# Patient Record
Sex: Male | Born: 1970 | Race: White | Hispanic: No | Marital: Married | Smoking: Never smoker
Health system: Southern US, Community
[De-identification: ages and names within clinical notes are randomized; demographics above are authoritative.]

## PROBLEM LIST (undated history)

## (undated) DIAGNOSIS — Z789 Other specified health status: Secondary | ICD-10-CM

---

## 2012-09-20 HISTORY — PX: MOUTH BIOPSY: SHX1012

## 2012-12-19 ENCOUNTER — Encounter (HOSPITAL_BASED_OUTPATIENT_CLINIC_OR_DEPARTMENT_OTHER): Payer: Self-pay | Admitting: *Deleted

## 2012-12-19 NOTE — Progress Notes (Signed)
No labs needed

## 2012-12-20 NOTE — H&P (Signed)
  This is a 42y/o wd/wn w male who presents with a large radiolucency of the left mandible.  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 The treatment plan is to enucleate the lesion and do a peripheral ostectomy and a chemical cauterization.

## 2012-12-20 NOTE — H&P (Signed)
Tanner Williams is an 42 y.o. male.   Chief Complaint: cyst left mandible HPI: unknown duration  Past Medical History  Diagnosis Date  . Medical history non-contributory     Past Surgical History  Procedure Laterality Date  . Mouth biopsy  7/14    jaw bone    History reviewed. No pertinent family history. Social History:  reports that he has never smoked. He does not have any smokeless tobacco history on file. He reports that  drinks alcohol. He reports that he does not use illicit drugs.  Allergies: No Known Allergies  No prescriptions prior to admission    No results found for this or any previous visit (from the past 48 hour(s)). No results found.  Review of Systems  All other systems reviewed and are negative.    Height 5\' 10"  (1.778 m), weight 86.183 kg (190 lb). Physical Exam  Vitals reviewed. Constitutional: He is oriented to person, place, and time. He appears well-developed and well-nourished.  HENT:  Head: Normocephalic and atraumatic.    Right Ear: External ear normal.  Left Ear: External ear normal.  Nose: Nose normal.  Mouth/Throat: Oropharynx is clear and moist.    Eyes: Conjunctivae and EOM are normal. Pupils are equal, round, and reactive to light.  Neck: Normal range of motion. Neck supple.  Cardiovascular: Normal rate, regular rhythm, normal heart sounds and intact distal pulses.   Respiratory: Effort normal and breath sounds normal.  GI: Soft. Bowel sounds are normal.  Musculoskeletal: Normal range of motion.  Neurological: He is alert and oriented to person, place, and time. He has normal reflexes.  Skin: Skin is warm and dry.  Psychiatric: He has a normal mood and affect. His behavior is normal. Judgment and thought content normal.     Assessment/Plan Surgical removal of tumor with mechanical and chemical cauterization  [name],JOSEPH L 12/20/2012, 5:02 PM

## 2012-12-22 ENCOUNTER — Ambulatory Visit (HOSPITAL_BASED_OUTPATIENT_CLINIC_OR_DEPARTMENT_OTHER): Payer: 59 | Admitting: *Deleted

## 2012-12-22 ENCOUNTER — Encounter (HOSPITAL_BASED_OUTPATIENT_CLINIC_OR_DEPARTMENT_OTHER): Payer: Self-pay | Admitting: *Deleted

## 2012-12-22 ENCOUNTER — Ambulatory Visit (HOSPITAL_BASED_OUTPATIENT_CLINIC_OR_DEPARTMENT_OTHER)
Admission: RE | Admit: 2012-12-22 | Discharge: 2012-12-22 | Disposition: A | Discharge status: Home / Self Care | Payer: 59 | Source: Ambulatory Visit | Attending: Oral Surgery | Admitting: Oral Surgery

## 2012-12-22 ENCOUNTER — Encounter (HOSPITAL_BASED_OUTPATIENT_CLINIC_OR_DEPARTMENT_OTHER)
Admission: RE | Disposition: A | Discharge status: Home / Self Care | Payer: Self-pay | Source: Ambulatory Visit | Attending: Oral Surgery

## 2012-12-22 DIAGNOSIS — K090 Developmental odontogenic cysts: Secondary | ICD-10-CM | POA: Insufficient documentation

## 2012-12-22 HISTORY — DX: Other specified health status: Z78.9

## 2012-12-22 HISTORY — PX: EXCISION ORAL TUMOR: SHX6265

## 2012-12-22 SURGERY — EXCISION, NEOPLASM, MOUTH
Anesthesia: General | Site: Mouth | Laterality: Left | Wound class: Clean Contaminated

## 2012-12-22 MED ORDER — CEPHALEXIN 500 MG PO CAPS: 500.0000 mg | ORAL_CAPSULE | Freq: Four times a day (QID) | ORAL | Status: DC

## 2012-12-22 MED ORDER — LIDOCAINE HCL (CARDIAC) 20 MG/ML IV SOLN
INTRAVENOUS | Status: DC | PRN
  Administered 2012-12-22: 40 mg via INTRAVENOUS

## 2012-12-22 MED ORDER — METHYLENE BLUE 1 % INJ SOLN
INTRAMUSCULAR | Status: DC | PRN
  Administered 2012-12-22: 5 mL via SUBMUCOSAL

## 2012-12-22 MED ORDER — PROPOFOL 10 MG/ML IV BOLUS
INTRAVENOUS | Status: DC | PRN
  Administered 2012-12-22: 250 mg via INTRAVENOUS

## 2012-12-22 MED ORDER — FENTANYL CITRATE 0.05 MG/ML IJ SOLN
INTRAMUSCULAR | Status: DC | PRN
  Administered 2012-12-22: 100 ug via INTRAVENOUS

## 2012-12-22 MED ORDER — OXYMETAZOLINE HCL 0.05 % NA SOLN
NASAL | Status: DC | PRN
  Administered 2012-12-22: 2 via NASAL

## 2012-12-22 MED ORDER — MIDAZOLAM HCL 2 MG/2ML IJ SOLN: 0.5000 mg | Freq: Once | INTRAMUSCULAR | Status: DC | PRN

## 2012-12-22 MED ORDER — MEPERIDINE HCL 25 MG/ML IJ SOLN: 6.2500 mg | INTRAMUSCULAR | Status: DC | PRN

## 2012-12-22 MED ORDER — OXYCODONE-ACETAMINOPHEN 5-325 MG PO TABS: 1.0000 | ORAL_TABLET | ORAL | Status: DC | PRN

## 2012-12-22 MED ORDER — ONDANSETRON HCL 4 MG/2ML IJ SOLN
INTRAMUSCULAR | Status: DC | PRN
  Administered 2012-12-22: 4 mg via INTRAVENOUS

## 2012-12-22 MED ORDER — HYDROMORPHONE HCL PF 1 MG/ML IJ SOLN: 0.2500 mg | INTRAMUSCULAR | Status: DC | PRN

## 2012-12-22 MED ORDER — PROMETHAZINE HCL 25 MG/ML IJ SOLN: 6.2500 mg | INTRAMUSCULAR | Status: DC | PRN

## 2012-12-22 MED ORDER — OXYCODONE-ACETAMINOPHEN 5-325 MG PO TABS: 1.0000 | ORAL_TABLET | ORAL | Status: AC | PRN

## 2012-12-22 MED ORDER — CEFAZOLIN SODIUM-DEXTROSE 2-3 GM-% IV SOLR
2.0000 g | INTRAVENOUS | Status: AC
  Administered 2012-12-22: 2 g via INTRAVENOUS

## 2012-12-22 MED ORDER — LACTATED RINGERS IV SOLN
INTRAVENOUS | Status: DC
  Administered 2012-12-22 (×2): via INTRAVENOUS

## 2012-12-22 MED ORDER — CEPHALEXIN 500 MG PO CAPS: 500.0000 mg | ORAL_CAPSULE | Freq: Four times a day (QID) | ORAL | Status: AC

## 2012-12-22 MED ORDER — ARTIFICIAL TEARS OP OINT
TOPICAL_OINTMENT | OPHTHALMIC | Status: DC | PRN
  Administered 2012-12-22: 1 via OPHTHALMIC

## 2012-12-22 MED ORDER — MIDAZOLAM HCL 5 MG/5ML IJ SOLN
INTRAMUSCULAR | Status: DC | PRN
  Administered 2012-12-22: 2 mg via INTRAVENOUS

## 2012-12-22 MED ORDER — SODIUM CHLORIDE 0.9 % IV SOLN
INTRAVENOUS | Status: DC | PRN
  Administered 2012-12-22: 100 mL via INTRAMUSCULAR

## 2012-12-22 MED ORDER — BACITRACIN-NEOMYCIN-POLYMYXIN 400-5-5000 EX OINT
TOPICAL_OINTMENT | CUTANEOUS | Status: DC | PRN
  Administered 2012-12-22: 1 via TOPICAL

## 2012-12-22 MED ORDER — OXYCODONE HCL 5 MG/5ML PO SOLN: 5.0000 mg | Freq: Once | ORAL | Status: DC | PRN

## 2012-12-22 MED ORDER — LIDOCAINE-EPINEPHRINE 2 %-1:100000 IJ SOLN
INTRAMUSCULAR | Status: DC | PRN
  Administered 2012-12-22: 3 mL

## 2012-12-22 MED ORDER — DEXAMETHASONE SODIUM PHOSPHATE 4 MG/ML IJ SOLN
INTRAMUSCULAR | Status: DC | PRN
  Administered 2012-12-22: 10 mg via INTRAVENOUS

## 2012-12-22 MED ORDER — OXYCODONE HCL 5 MG PO TABS: 5.0000 mg | ORAL_TABLET | Freq: Once | ORAL | Status: DC | PRN

## 2012-12-22 MED ORDER — SUCCINYLCHOLINE CHLORIDE 20 MG/ML IJ SOLN
INTRAMUSCULAR | Status: DC | PRN
  Administered 2012-12-22: 140 mg via INTRAVENOUS

## 2012-12-22 SURGICAL SUPPLY — 35 items
BENZOIN TINCTURE PRP APPL 2/3 (GAUZE/BANDAGES/DRESSINGS) ×28 IMPLANT
BLADE SURG 15 STRL LF DISP TIS (BLADE) ×1 IMPLANT
BLADE SURG 15 STRL SS (BLADE) ×1
BUR OVAL 4.0X59 (BURR) ×2 IMPLANT
CANISTER SUCTION 1200CC (MISCELLANEOUS) ×2 IMPLANT
CARNOY"S SOLUTION ×2 IMPLANT
CATH ROBINSON RED A/P 10FR (CATHETERS) IMPLANT
CLOTH BEACON ORANGE TIMEOUT ST (SAFETY) IMPLANT
COVER MAYO STAND STRL (DRAPES) ×2 IMPLANT
COVER TABLE BACK 60X90 (DRAPES) ×2 IMPLANT
DRAPE U-SHAPE 76X120 STRL (DRAPES) ×2 IMPLANT
GAUZE PACKING IODOFORM 1/4X5 (PACKING) ×2 IMPLANT
GLOVE BIO SURGEON STRL SZ 6.5 (GLOVE) ×4 IMPLANT
GLOVE BIO SURGEON STRL SZ7.5 (GLOVE) ×2 IMPLANT
GLOVE EXAM NITRILE MD LF STRL (GLOVE) ×2 IMPLANT
GOWN PREVENTION PLUS XLARGE (GOWN DISPOSABLE) ×4 IMPLANT
GOWN PREVENTION PLUS XXLARGE (GOWN DISPOSABLE) ×2 IMPLANT
IV NS 500ML (IV SOLUTION)
IV NS 500ML BAXH (IV SOLUTION) IMPLANT
NEEDLE DENTAL 27 LONG (NEEDLE) ×2 IMPLANT
NS IRRIG 1000ML POUR BTL (IV SOLUTION) ×2 IMPLANT
PACK BASIN DAY SURGERY FS (CUSTOM PROCEDURE TRAY) ×2 IMPLANT
PATTIES SURGICAL .5 X3 (DISPOSABLE) ×2 IMPLANT
SLEEVE SCD COMPRESS KNEE MED (MISCELLANEOUS) ×2 IMPLANT
SPONGE SURGIFOAM ABS GEL 12-7 (HEMOSTASIS) IMPLANT
SUT CHROMIC 3 0 PS 2 (SUTURE) ×2 IMPLANT
SUT CHROMIC 4 0 P 3 18 (SUTURE) IMPLANT
SUT SILK 3 0 PS 1 (SUTURE) IMPLANT
SUT VICRYL 4-0 PS2 18IN ABS (SUTURE) ×4 IMPLANT
SYR 50ML LL SCALE MARK (SYRINGE) ×4 IMPLANT
TOWEL OR 17X24 6PK STRL BLUE (TOWEL DISPOSABLE) ×2 IMPLANT
TOWEL OR NON WOVEN STRL DISP B (DISPOSABLE) ×2 IMPLANT
TUBE CONNECTING 20X1/4 (TUBING) ×2 IMPLANT
VENT IRR SPI W TUB AD (MISCELLANEOUS) IMPLANT
YANKAUER SUCT BULB TIP NO VENT (SUCTIONS) ×2 IMPLANT

## 2012-12-22 NOTE — Anesthesia Procedure Notes (Addendum)
Procedure Name: Intubation Date/Time: 12/22/2012 1:20 PM Performed by: Meyer Russel Pre-anesthesia Checklist: Patient identified, Emergency Drugs available, Suction available and Patient being monitored Patient Re-evaluated:Patient Re-evaluated prior to inductionOxygen Delivery Method: Circle System Utilized Preoxygenation: Pre-oxygenation with 100% oxygen Intubation Type: IV induction Ventilation: Mask ventilation without difficulty Laryngoscope Size: Miller and 2 Grade View: Grade I Nasal Tubes: Nasal prep performed, Nasal Rae and Right Tube size: 7.0 mm Number of attempts: 1 Placement Confirmation: ETT inserted through vocal cords under direct vision,  positive ETCO2 and breath sounds checked- equal and bilateral Secured at: 27 (NTT secured, tip of nose checked, gauze under  NTT at forehead) cm Tube secured with: Tape Dental Injury: Teeth and Oropharynx as per pre-operative assessment

## 2012-12-22 NOTE — Anesthesia Postprocedure Evaluation (Signed)
  Anesthesia Post-op Note  Patient: Tanner Williams  Procedure(s) Performed: Procedure(s): SURGICAL REMOVAL OF TUMOR WITH CARNOYS SOLUTION, PERTHERAL OSTECTOMY, EXTRACTIONS OF TEETH #16 & #17 (Left)  Patient Location: PACU  Anesthesia Type:General  Level of Consciousness: awake, alert , oriented and patient cooperative  Airway and Oxygen Therapy: Patient Spontanous Breathing  Post-op Pain: mild  Post-op Assessment: Post-op Vital signs reviewed, Patient's Cardiovascular Status Stable, Respiratory Function Stable, Patent Airway, No signs of Nausea or vomiting and Pain level controlled  Post-op Vital Signs: Reviewed and stable  Complications: No apparent anesthesia complications

## 2012-12-22 NOTE — Brief Op Note (Signed)
12/22/2012  2:27 PM  PATIENT:  Riccardo Dubin  42 y.o. male  PRE-OPERATIVE DIAGNOSIS:  ODONTOGENIC KERATOCYST  POST-OPERATIVE DIAGNOSIS:  ODONTOGENIC KERATOCYST  PROCEDURE:  Procedure(s): SURGICAL REMOVAL OF TUMOR WITH CARNOYS SOLUTION, PERTHERAL OSTECTOMY, EXTRACTIONS OF TEETH #16 & #17 (Left)  SURGEON:  Surgeon(s) and Role:    * Hinton Dyer, DDS - Primary  PHYSICIAN ASSISTANT:   ASSISTANTS: Hadassah Pais, Montel Culver   ANESTHESIA:   general  EBL:  Total I/O In: 1650 [I.V.:1650] Out: -   BLOOD ADMINISTERED:none  DRAINS: Penrose drain in the surgical defect   LOCAL MEDICATIONS USED:  XYLOCAINE   SPECIMEN:  Source of Specimen:  left lesion of mandible  DISPOSITION OF SPECIMEN:  PATHOLOGY  COUNTS:  YES  TOURNIQUET:  * No tourniquets in log *  DICTATION: .Dragon Dictation  PLAN OF CARE: Discharge to home after PACU  PATIENT DISPOSITION:  PACU - hemodynamically stable.   Delay start of Pharmacological VTE agent (>24hrs) due to surgical blood loss or risk of bleeding:n/a

## 2012-12-22 NOTE — H&P (Signed)
  There is no change in the medical history.  Procedure has been reviewed.

## 2012-12-22 NOTE — Op Note (Signed)
Patient was brought to the operating room in the supine position and which remained throughout the whole procedure. He was intubated via right nasoendotracheal tube.  He was then prepped with Betadine scrub and paint intraorally and extraorally. He was then prepped and draped in usual fashion for an intraoral procedure. The throat was suctioned out and a moist open 4 x 4 gauze was placed around the endotracheal tube. 2 carpules of 2% Xylocaine with 1-100,000 epinephrine was given as a block and infiltration of the left maxilla and mandible. A child-size bite block was placed. A periosteal elevator reflected a full-thickness mucoperiosteal flap around #16 and #17. #16 was mobilized with a 11-A. elevator and removed with an upper universal forceps. The socket was curetted and closed with a 3-0 chromic suture. #17 was mobilized with an 11-A. elevator and removed with a lower universal forceps. A #15 blade then made an incision down the ascending ramus to the first second molar region. A small release was created the area between #18 and #19. A full-thickness mucoperiosteal flap was elevated with a periosteal elevator. Bone covering the lesion was then removed with a round bur copious irrigation. Once the opening could be visualized it was enlarged to expose the whole tumor. A large curved curette was then used to to separated from the bony walls of the buccal and lingual bone. The lesion was removed in several pieces and submitted for pathological examination. Once the whole area was free of any soft tissue was aggressively curetted and irrigated. He was then filled with methylene blue and suctioned out so as to stain the bone. A peripheral ostectomy was then performed using a large Alfio bur and copious irrigation. Once the area was completely free of any of the stained bone it was irrigated copiously with several 100 cc's of saline. Gauze soaked with the coronoid solution was then packed into the defect and left in  place for 5 minutes. He was then removed and the area was irrigated. One quarter-inch iodoform gauze covered with benzoin and Neosporin was then packed into the defect to obliterate the bony defect. He was then brought out to the buccal aspect with releasing incision was the area was closed with multiple 4-0 Vicryl sutures. Primary closure was accomplished. The patient tolerated the procedure well and was extubated on the table after removal of the throat pack. He was then returned to the recovery room in good condition. He'll be followed by me in my private office.

## 2012-12-22 NOTE — Transfer of Care (Signed)
Immediate Anesthesia Transfer of Care Note  Patient: Tanner Williams  Procedure(s) Performed: Procedure(s): SURGICAL REMOVAL OF TUMOR WITH CARNOYS SOLUTION, PERTHERAL OSTECTOMY, EXTRACTIONS OF TEETH #16 & #17 (Left)  Patient Location: PACU  Anesthesia Type:General  Level of Consciousness: awake, alert  and oriented  Airway & Oxygen Therapy: Patient Spontanous Breathing and Patient connected to face mask oxygen  Post-op Assessment: Report given to PACU RN, Post -op Vital signs reviewed and stable and Patient moving all extremities  Post vital signs: Reviewed and stable  Complications: No apparent anesthesia complications

## 2012-12-22 NOTE — Anesthesia Preprocedure Evaluation (Signed)
Anesthesia Evaluation  Patient identified by MRN, date of birth, ID band Patient awake    Reviewed: Allergy & Precautions, H&P , NPO status , Patient's Chart, lab work & pertinent test results  History of Anesthesia Complications Negative for: history of anesthetic complications  Airway Mallampati: I TM Distance: >3 FB Neck ROM: Full    Dental  (+) Teeth Intact and Dental Advisory Given   Pulmonary neg pulmonary ROS,  breath sounds clear to auscultation  Pulmonary exam normal       Cardiovascular negative cardio ROS  Rhythm:Regular Rate:Normal     Neuro/Psych negative neurological ROS     GI/Hepatic negative GI ROS, Neg liver ROS,   Endo/Other  negative endocrine ROS  Renal/GU negative Renal ROS     Musculoskeletal   Abdominal   Peds  Hematology   Anesthesia Other Findings   Reproductive/Obstetrics                           Anesthesia Physical Anesthesia Plan  ASA: I  Anesthesia Plan: General   Post-op Pain Management:    Induction: Intravenous  Airway Management Planned: Nasal ETT  Additional Equipment:   Intra-op Plan:   Post-operative Plan: Extubation in OR  Informed Consent: I have reviewed the patients History and Physical, chart, labs and discussed the procedure including the risks, benefits and alternatives for the proposed anesthesia with the patient or authorized representative who has indicated his/her understanding and acceptance.   Dental advisory given  Plan Discussed with: CRNA and Surgeon  Anesthesia Plan Comments: (Plan routine monitors, GETA with NT ETT)        Anesthesia Quick Evaluation

## 2012-12-26 ENCOUNTER — Encounter (HOSPITAL_BASED_OUTPATIENT_CLINIC_OR_DEPARTMENT_OTHER): Payer: Self-pay | Admitting: Oral Surgery

## 2013-01-17 ENCOUNTER — Ambulatory Visit
Admission: RE | Admit: 2013-01-17 | Discharge: 2013-01-17 | Disposition: A | Discharge status: Home / Self Care | Payer: 59 | Source: Ambulatory Visit | Attending: Physician Assistant | Admitting: Physician Assistant

## 2013-01-17 ENCOUNTER — Other Ambulatory Visit: Payer: Self-pay | Admitting: Physician Assistant

## 2013-01-17 DIAGNOSIS — M545 Low back pain, unspecified: Secondary | ICD-10-CM

## 2015-01-24 ENCOUNTER — Other Ambulatory Visit: Payer: Self-pay | Admitting: Family Medicine

## 2015-01-24 DIAGNOSIS — R202 Paresthesia of skin: Secondary | ICD-10-CM

## 2015-02-13 ENCOUNTER — Ambulatory Visit
Admission: RE | Admit: 2015-02-13 | Discharge: 2015-02-13 | Disposition: A | Discharge status: Home / Self Care | Payer: Self-pay | Source: Ambulatory Visit | Attending: Family Medicine | Admitting: Family Medicine

## 2015-02-13 ENCOUNTER — Other Ambulatory Visit: Payer: Self-pay | Admitting: Family Medicine

## 2015-02-13 ENCOUNTER — Ambulatory Visit
Admission: RE | Admit: 2015-02-13 | Discharge: 2015-02-13 | Disposition: A | Discharge status: Home / Self Care | Payer: Commercial Managed Care - HMO | Source: Ambulatory Visit | Attending: Family Medicine | Admitting: Family Medicine

## 2015-02-13 DIAGNOSIS — Z77018 Contact with and (suspected) exposure to other hazardous metals: Secondary | ICD-10-CM

## 2015-02-13 DIAGNOSIS — R202 Paresthesia of skin: Secondary | ICD-10-CM

## 2016-01-29 ENCOUNTER — Other Ambulatory Visit: Payer: Self-pay | Admitting: Family Medicine

## 2016-01-29 ENCOUNTER — Ambulatory Visit
Admission: RE | Admit: 2016-01-29 | Discharge: 2016-01-29 | Disposition: A | Discharge status: Home / Self Care | Payer: Commercial Managed Care - HMO | Source: Ambulatory Visit | Attending: Family Medicine | Admitting: Family Medicine

## 2016-01-29 DIAGNOSIS — R1032 Left lower quadrant pain: Secondary | ICD-10-CM

## 2018-04-23 IMAGING — CR DG ABDOMEN 2V
2 series · 2 of 2 positions shown · non-contrast
Comparison: Lumbar spine series of January 17, 2013

CLINICAL DATA: Left lower quadrant pain for the past 2 weeks

EXAM:
ABDOMEN - 2 VIEW

[w abdomen upright *]
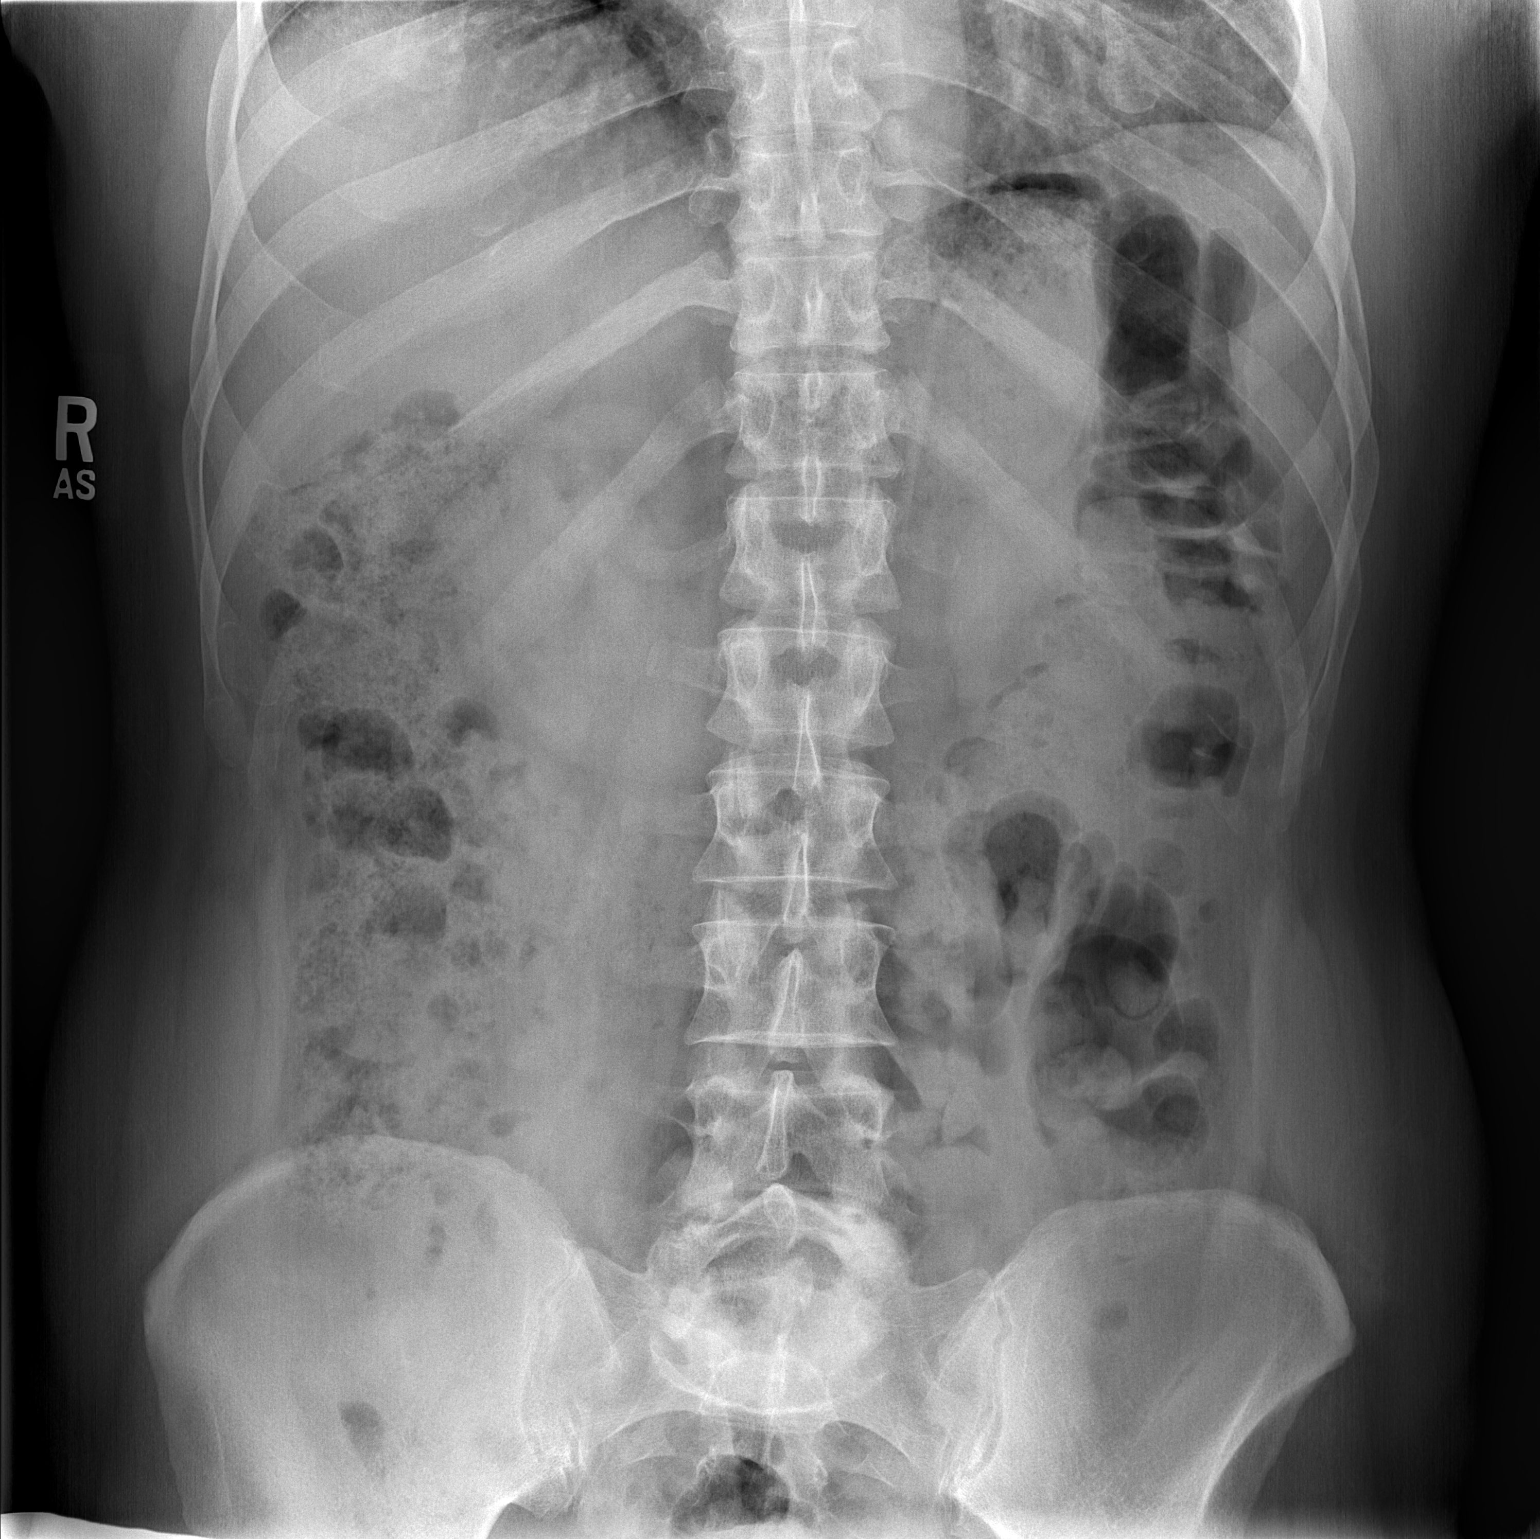

[t abdomen supine]
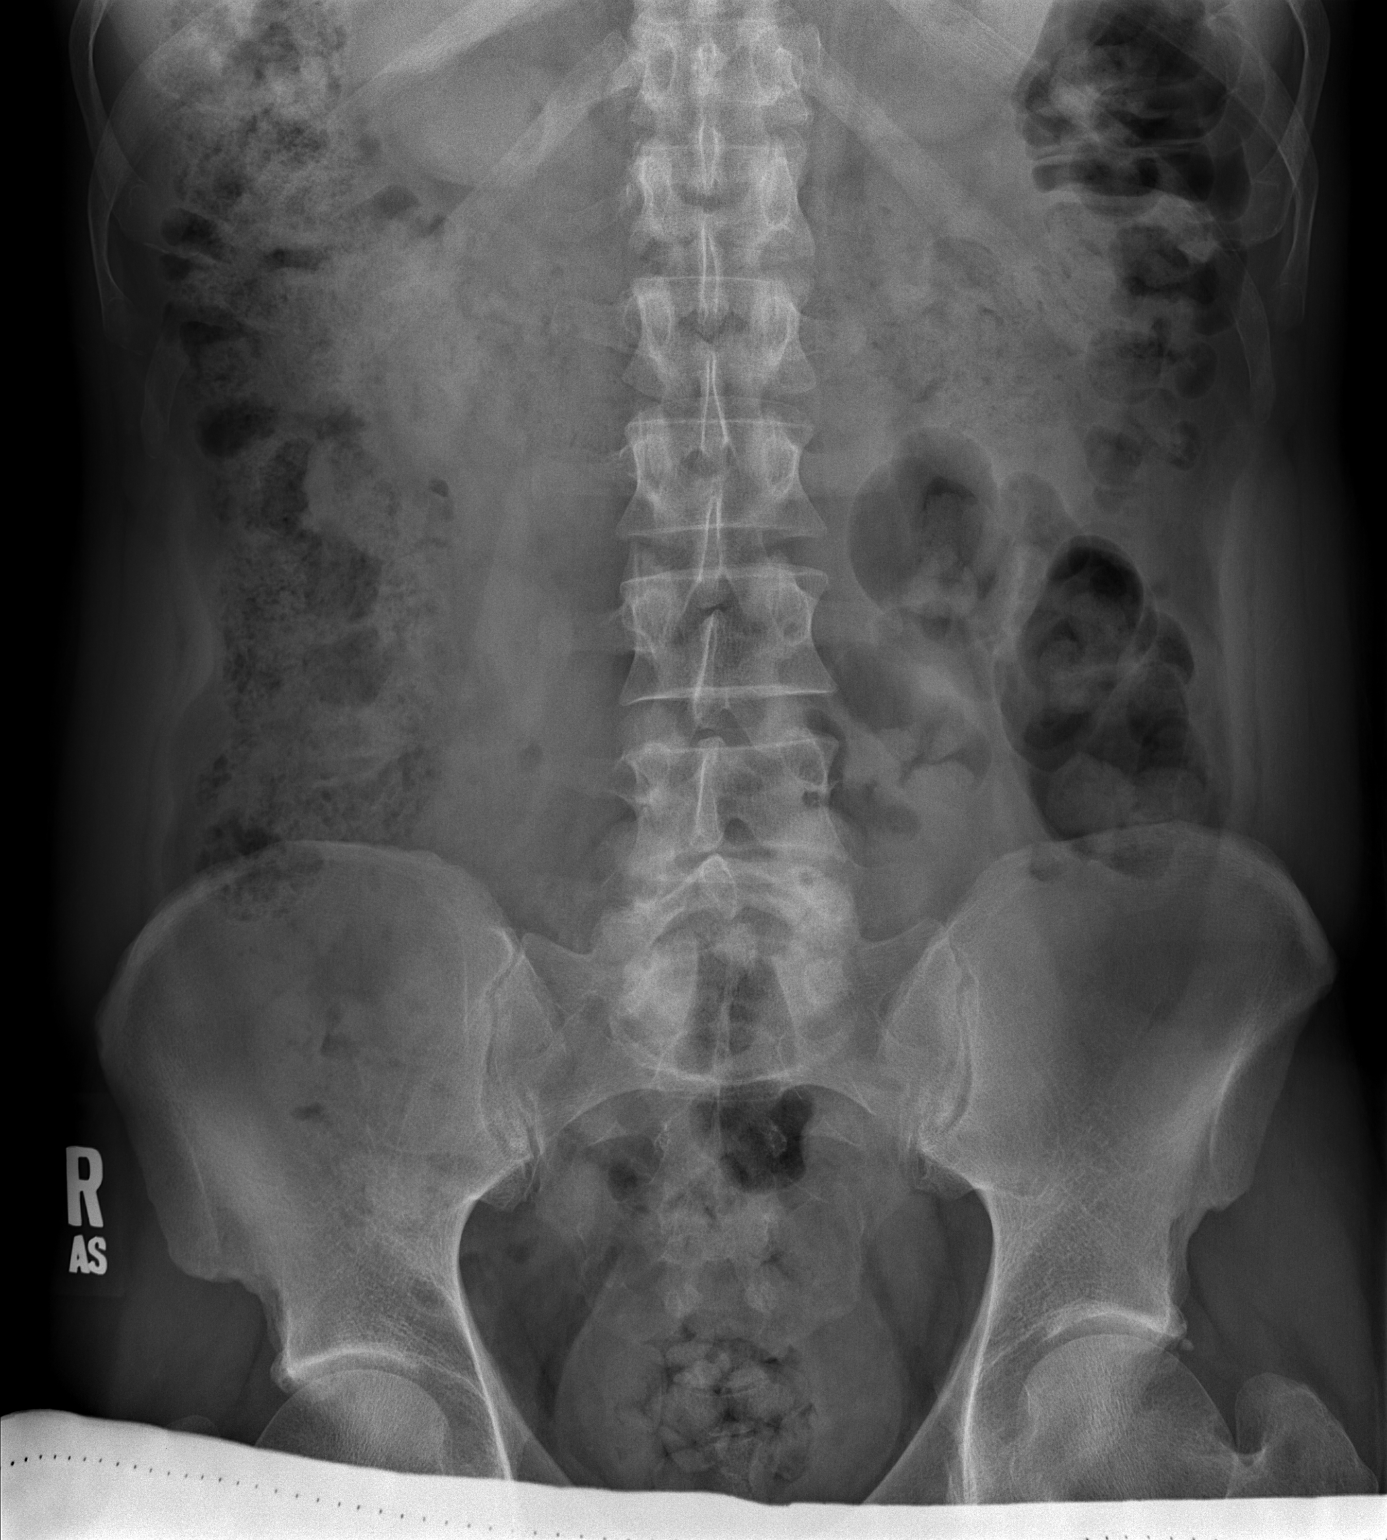

[2 of 2 positions shown; findings below may reference images not displayed]

FINDINGS: The colonic stool burden is increased. No abnormal soft tissue
calcifications are observed. There are no free extraluminal gas
collections. The bony structures exhibit no acute abnormalities.
IMPRESSION: Increased colonic stool burden consistent with constipation in the
appropriate clinical setting. No acute intra-abdominal abnormality
is observed.

## 2019-05-27 ENCOUNTER — Ambulatory Visit: Payer: Medicaid Other | Attending: Internal Medicine

## 2019-05-27 DIAGNOSIS — Z23 Encounter for immunization: Secondary | ICD-10-CM | POA: Insufficient documentation

## 2019-05-27 NOTE — Progress Notes (Signed)
   Covid-19 Vaccination Clinic  Name:  Tanner Williams    MRN: [id number] DOB: 1970-08-29  05/27/2019  Mr. [name] was observed post Covid-19 immunization for 15 minutes without incident. He was provided with Vaccine Information Sheet and instruction to access the V-Safe system.   [name] was instructed to call 911 with any severe reactions post vaccine: Marland Kitchen Difficulty breathing  . Swelling of face and throat  . A fast heartbeat  . A bad rash all over body  . Dizziness and weakness   Immunizations Administered    Name Date Dose VIS Date Route   Pfizer COVID-19 Vaccine 05/27/2019 11:55 AM 0.3 mL 03/03/2019 Intramuscular   Manufacturer: [employer], [employer]   Lot: [id number]   NDC: [address]-2

## 2019-06-20 ENCOUNTER — Ambulatory Visit: Payer: BLUE CROSS/BLUE SHIELD | Attending: Internal Medicine

## 2019-06-20 DIAGNOSIS — Z23 Encounter for immunization: Secondary | ICD-10-CM

## 2019-06-20 NOTE — Progress Notes (Signed)
   Covid-19 Vaccination Clinic  Name:  Tanner Williams    MRN: [id number] DOB: 1970-06-29  06/20/2019  Tanner Williams was observed post Covid-19 immunization for 15 minutes without incident. He was provided with Vaccine Information Sheet and instruction to access the V-Safe system.   Tanner Williams was instructed to call 911 with any severe reactions post vaccine: Marland Kitchen Difficulty breathing  . Swelling of face and throat  . A fast heartbeat  . A bad rash all over body  . Dizziness and weakness   Immunizations Administered    Name Date Dose VIS Date Route   Pfizer COVID-19 Vaccine 06/20/2019 10:45 AM 0.3 mL 03/03/2019 Intramuscular   Manufacturer: [employer], [employer]   Lot: [id number]   NDC: [address]-5

## 2020-06-14 ENCOUNTER — Other Ambulatory Visit: Payer: Self-pay | Admitting: Family Medicine

## 2020-06-14 MED FILL — OXYCODONE-APAP 5-325MG: 5-325 | 3 days supply | Qty: 15 | Fill #0

## 2020-09-17 ENCOUNTER — Other Ambulatory Visit (HOSPITAL_COMMUNITY): Payer: Self-pay

## 2020-09-17 MED ORDER — CLINDAMYCIN PHOS-BENZOYL PEROX 1-5 % EX GEL
CUTANEOUS | 4 refills | Status: AC
  Filled 2020-09-17 – 2020-12-09 (×2): qty 50, 30d supply, fill #0

## 2020-09-18 ENCOUNTER — Other Ambulatory Visit (HOSPITAL_COMMUNITY): Payer: Self-pay

## 2020-09-21 ENCOUNTER — Other Ambulatory Visit (HOSPITAL_COMMUNITY): Payer: Self-pay

## 2020-09-24 ENCOUNTER — Other Ambulatory Visit (HOSPITAL_COMMUNITY): Payer: Self-pay

## 2020-09-25 ENCOUNTER — Other Ambulatory Visit (HOSPITAL_COMMUNITY): Payer: Self-pay

## 2020-09-30 ENCOUNTER — Other Ambulatory Visit (HOSPITAL_COMMUNITY): Payer: Self-pay

## 2020-10-01 ENCOUNTER — Other Ambulatory Visit (HOSPITAL_COMMUNITY): Payer: Self-pay

## 2020-10-03 ENCOUNTER — Other Ambulatory Visit (HOSPITAL_COMMUNITY): Payer: Self-pay

## 2020-10-08 ENCOUNTER — Other Ambulatory Visit (HOSPITAL_COMMUNITY): Payer: Self-pay

## 2020-10-10 ENCOUNTER — Other Ambulatory Visit (HOSPITAL_COMMUNITY): Payer: Self-pay

## 2020-10-16 ENCOUNTER — Other Ambulatory Visit (HOSPITAL_COMMUNITY): Payer: Self-pay

## 2020-10-29 ENCOUNTER — Other Ambulatory Visit (HOSPITAL_COMMUNITY): Payer: Self-pay

## 2020-12-09 ENCOUNTER — Other Ambulatory Visit (HOSPITAL_COMMUNITY): Payer: Self-pay

## 2020-12-09 MED ORDER — CARESTART COVID-19 HOME TEST VI KIT
PACK | 0 refills | Status: AC
  Filled 2020-12-09: qty 4, 4d supply, fill #0

## 2020-12-09 MED ORDER — CLINDAMYCIN PHOSPHATE 1 % EX GEL
CUTANEOUS | 5 refills | Status: AC
  Filled 2020-12-09: qty 30, 30d supply, fill #0

## 2021-03-06 ENCOUNTER — Other Ambulatory Visit (HOSPITAL_COMMUNITY): Payer: Self-pay

## 2021-03-06 MED ORDER — OSELTAMIVIR PHOSPHATE 75 MG PO CAPS
ORAL_CAPSULE | ORAL | 0 refills | Status: AC
  Filled 2021-03-06: qty 10, 10d supply, fill #0

## 2021-03-18 ENCOUNTER — Other Ambulatory Visit (HOSPITAL_COMMUNITY): Payer: Self-pay

## 2021-03-18 MED ORDER — CARESTART COVID-19 HOME TEST VI KIT
PACK | 1 refills | Status: AC
  Filled 2021-03-18: qty 4, 4d supply, fill #0
  Filled 2021-07-29: qty 4, 4d supply, fill #1

## 2021-04-18 ENCOUNTER — Other Ambulatory Visit (HOSPITAL_COMMUNITY): Payer: Self-pay

## 2021-04-18 MED ORDER — CARESTART COVID-19 HOME TEST VI KIT
PACK | 0 refills | Status: AC
  Filled 2021-04-18: qty 4, 4d supply, fill #0

## 2021-05-15 ENCOUNTER — Other Ambulatory Visit (HOSPITAL_COMMUNITY): Payer: Self-pay

## 2021-07-29 ENCOUNTER — Other Ambulatory Visit (HOSPITAL_COMMUNITY): Payer: Self-pay

## 2021-08-01 ENCOUNTER — Other Ambulatory Visit (HOSPITAL_COMMUNITY): Payer: Self-pay

## 2021-08-25 ENCOUNTER — Other Ambulatory Visit (HOSPITAL_COMMUNITY): Payer: Self-pay

## 2021-08-25 MED ORDER — CLINDAMYCIN PHOSPHATE 1 % EX GEL
CUTANEOUS | 5 refills | Status: AC
  Filled 2021-08-25: qty 30, 30d supply, fill #0
  Filled 2022-04-13 – 2022-05-14 (×3): qty 30, 30d supply, fill #1

## 2021-08-26 ENCOUNTER — Other Ambulatory Visit (HOSPITAL_COMMUNITY): Payer: Self-pay

## 2021-08-27 ENCOUNTER — Other Ambulatory Visit (HOSPITAL_COMMUNITY): Payer: Self-pay

## 2022-01-23 ENCOUNTER — Other Ambulatory Visit (HOSPITAL_COMMUNITY): Payer: Self-pay

## 2022-01-23 MED ORDER — PEG 3350-KCL-NA BICARB-NACL 420 G PO SOLR
ORAL | 0 refills | Status: AC
  Filled 2022-01-23: qty 4000, 1d supply, fill #0

## 2022-04-08 DIAGNOSIS — F4321 Adjustment disorder with depressed mood: Secondary | ICD-10-CM | POA: Diagnosis not present

## 2022-04-13 ENCOUNTER — Other Ambulatory Visit (HOSPITAL_COMMUNITY): Payer: Self-pay

## 2022-04-13 MED ORDER — CLINDAMYCIN PHOSPHATE 1 % EX SOLN
CUTANEOUS | 5 refills | Status: AC
  Filled 2022-04-13: qty 60, 30d supply, fill #0
  Filled 2022-05-14: qty 60, 30d supply, fill #1

## 2022-04-15 ENCOUNTER — Other Ambulatory Visit (HOSPITAL_COMMUNITY): Payer: Self-pay

## 2022-04-15 DIAGNOSIS — F4321 Adjustment disorder with depressed mood: Secondary | ICD-10-CM | POA: Diagnosis not present

## 2022-04-22 DIAGNOSIS — F4321 Adjustment disorder with depressed mood: Secondary | ICD-10-CM | POA: Diagnosis not present

## 2022-05-06 DIAGNOSIS — F4323 Adjustment disorder with mixed anxiety and depressed mood: Secondary | ICD-10-CM | POA: Diagnosis not present

## 2022-05-14 ENCOUNTER — Other Ambulatory Visit (HOSPITAL_COMMUNITY): Payer: Self-pay

## 2022-05-20 DIAGNOSIS — F4323 Adjustment disorder with mixed anxiety and depressed mood: Secondary | ICD-10-CM | POA: Diagnosis not present

## 2022-05-27 DIAGNOSIS — F4321 Adjustment disorder with depressed mood: Secondary | ICD-10-CM | POA: Diagnosis not present

## 2022-06-03 DIAGNOSIS — F4321 Adjustment disorder with depressed mood: Secondary | ICD-10-CM | POA: Diagnosis not present

## 2022-06-10 DIAGNOSIS — F4321 Adjustment disorder with depressed mood: Secondary | ICD-10-CM | POA: Diagnosis not present

## 2022-06-18 ENCOUNTER — Other Ambulatory Visit (HOSPITAL_COMMUNITY): Payer: Self-pay

## 2022-06-18 DIAGNOSIS — L918 Other hypertrophic disorders of the skin: Secondary | ICD-10-CM | POA: Diagnosis not present

## 2022-06-18 DIAGNOSIS — L814 Other melanin hyperpigmentation: Secondary | ICD-10-CM | POA: Diagnosis not present

## 2022-06-18 DIAGNOSIS — L578 Other skin changes due to chronic exposure to nonionizing radiation: Secondary | ICD-10-CM | POA: Diagnosis not present

## 2022-06-18 DIAGNOSIS — L700 Acne vulgaris: Secondary | ICD-10-CM | POA: Diagnosis not present

## 2022-06-18 MED ORDER — TRETINOIN 0.025 % EX CREA
TOPICAL_CREAM | CUTANEOUS | 3 refills | Status: AC
  Filled 2022-06-18: qty 45, 30d supply, fill #0
  Filled 2023-01-15 (×3): qty 45, 30d supply, fill #1

## 2022-06-24 DIAGNOSIS — F4321 Adjustment disorder with depressed mood: Secondary | ICD-10-CM | POA: Diagnosis not present

## 2022-07-01 DIAGNOSIS — F4321 Adjustment disorder with depressed mood: Secondary | ICD-10-CM | POA: Diagnosis not present

## 2022-08-21 DIAGNOSIS — F4321 Adjustment disorder with depressed mood: Secondary | ICD-10-CM | POA: Diagnosis not present

## 2022-08-28 DIAGNOSIS — F4321 Adjustment disorder with depressed mood: Secondary | ICD-10-CM | POA: Diagnosis not present

## 2022-08-31 DIAGNOSIS — Z1211 Encounter for screening for malignant neoplasm of colon: Secondary | ICD-10-CM | POA: Diagnosis not present

## 2022-08-31 DIAGNOSIS — Z8042 Family history of malignant neoplasm of prostate: Secondary | ICD-10-CM | POA: Diagnosis not present

## 2022-08-31 DIAGNOSIS — L709 Acne, unspecified: Secondary | ICD-10-CM | POA: Diagnosis not present

## 2022-08-31 DIAGNOSIS — E782 Mixed hyperlipidemia: Secondary | ICD-10-CM | POA: Diagnosis not present

## 2022-08-31 DIAGNOSIS — Z0000 Encounter for general adult medical examination without abnormal findings: Secondary | ICD-10-CM | POA: Diagnosis not present

## 2022-08-31 DIAGNOSIS — Z125 Encounter for screening for malignant neoplasm of prostate: Secondary | ICD-10-CM | POA: Diagnosis not present

## 2022-08-31 DIAGNOSIS — Z9289 Personal history of other medical treatment: Secondary | ICD-10-CM | POA: Diagnosis not present

## 2022-09-01 ENCOUNTER — Other Ambulatory Visit (HOSPITAL_BASED_OUTPATIENT_CLINIC_OR_DEPARTMENT_OTHER): Payer: Self-pay | Admitting: Family Medicine

## 2022-09-01 DIAGNOSIS — E782 Mixed hyperlipidemia: Secondary | ICD-10-CM

## 2022-09-03 DIAGNOSIS — F4321 Adjustment disorder with depressed mood: Secondary | ICD-10-CM | POA: Diagnosis not present

## 2022-09-04 ENCOUNTER — Ambulatory Visit (HOSPITAL_BASED_OUTPATIENT_CLINIC_OR_DEPARTMENT_OTHER)
Admission: RE | Admit: 2022-09-04 | Discharge: 2022-09-04 | Disposition: A | Discharge status: Home / Self Care | Payer: BLUE CROSS/BLUE SHIELD | Source: Ambulatory Visit | Attending: Family Medicine | Admitting: Family Medicine

## 2022-09-04 DIAGNOSIS — E782 Mixed hyperlipidemia: Secondary | ICD-10-CM

## 2022-09-18 DIAGNOSIS — F4321 Adjustment disorder with depressed mood: Secondary | ICD-10-CM | POA: Diagnosis not present

## 2022-09-25 ENCOUNTER — Other Ambulatory Visit (HOSPITAL_COMMUNITY): Payer: Self-pay

## 2022-09-25 DIAGNOSIS — R253 Fasciculation: Secondary | ICD-10-CM | POA: Diagnosis not present

## 2022-09-25 DIAGNOSIS — M5412 Radiculopathy, cervical region: Secondary | ICD-10-CM | POA: Diagnosis not present

## 2022-09-25 MED ORDER — CYCLOBENZAPRINE HCL 10 MG PO TABS
10.0000 mg | ORAL_TABLET | Freq: Every evening | ORAL | 0 refills | Status: AC
  Filled 2022-09-25: qty 10, 10d supply, fill #0

## 2022-09-25 MED ORDER — NAPROXEN 500 MG PO TABS
500.0000 mg | ORAL_TABLET | Freq: Two times a day (BID) | ORAL | 0 refills | Status: AC | PRN
  Filled 2022-09-25: qty 14, 7d supply, fill #0

## 2022-09-30 DIAGNOSIS — R946 Abnormal results of thyroid function studies: Secondary | ICD-10-CM | POA: Diagnosis not present

## 2022-10-01 DIAGNOSIS — F4321 Adjustment disorder with depressed mood: Secondary | ICD-10-CM | POA: Diagnosis not present

## 2022-10-30 DIAGNOSIS — F4321 Adjustment disorder with depressed mood: Secondary | ICD-10-CM | POA: Diagnosis not present

## 2022-11-06 DIAGNOSIS — F4321 Adjustment disorder with depressed mood: Secondary | ICD-10-CM | POA: Diagnosis not present

## 2022-11-12 DIAGNOSIS — F4321 Adjustment disorder with depressed mood: Secondary | ICD-10-CM | POA: Diagnosis not present

## 2022-11-19 ENCOUNTER — Other Ambulatory Visit: Payer: Self-pay | Admitting: Oral Surgery

## 2022-11-19 DIAGNOSIS — D165 Benign neoplasm of lower jaw bone: Secondary | ICD-10-CM

## 2022-11-20 DIAGNOSIS — F4321 Adjustment disorder with depressed mood: Secondary | ICD-10-CM | POA: Diagnosis not present

## 2022-12-03 ENCOUNTER — Ambulatory Visit: Payer: 59

## 2022-12-03 DIAGNOSIS — J3489 Other specified disorders of nose and nasal sinuses: Secondary | ICD-10-CM | POA: Diagnosis not present

## 2022-12-03 DIAGNOSIS — D165 Benign neoplasm of lower jaw bone: Secondary | ICD-10-CM | POA: Diagnosis not present

## 2022-12-04 DIAGNOSIS — F4321 Adjustment disorder with depressed mood: Secondary | ICD-10-CM | POA: Diagnosis not present

## 2022-12-10 DIAGNOSIS — F4321 Adjustment disorder with depressed mood: Secondary | ICD-10-CM | POA: Diagnosis not present

## 2023-01-01 DIAGNOSIS — F4321 Adjustment disorder with depressed mood: Secondary | ICD-10-CM | POA: Diagnosis not present

## 2023-01-07 DIAGNOSIS — F4321 Adjustment disorder with depressed mood: Secondary | ICD-10-CM | POA: Diagnosis not present

## 2023-01-15 ENCOUNTER — Other Ambulatory Visit (HOSPITAL_COMMUNITY): Payer: Self-pay

## 2023-01-22 ENCOUNTER — Other Ambulatory Visit (HOSPITAL_COMMUNITY): Payer: Self-pay

## 2023-01-22 DIAGNOSIS — F4321 Adjustment disorder with depressed mood: Secondary | ICD-10-CM | POA: Diagnosis not present

## 2023-02-12 DIAGNOSIS — F4321 Adjustment disorder with depressed mood: Secondary | ICD-10-CM | POA: Diagnosis not present

## 2023-02-26 DIAGNOSIS — F4321 Adjustment disorder with depressed mood: Secondary | ICD-10-CM | POA: Diagnosis not present

## 2023-04-02 DIAGNOSIS — F4321 Adjustment disorder with depressed mood: Secondary | ICD-10-CM | POA: Diagnosis not present

## 2023-05-02 ENCOUNTER — Telehealth: Payer: Commercial Managed Care - PPO | Admitting: Physician Assistant

## 2023-05-02 DIAGNOSIS — M79671 Pain in right foot: Secondary | ICD-10-CM | POA: Diagnosis not present

## 2023-05-02 NOTE — Progress Notes (Signed)
 Virtual Visit Consent   Tanner Williams, you are scheduled for a virtual visit with a [facility] provider today. Just as with appointments in the office, your consent must be obtained to participate. Your consent will be active for this visit and any virtual visit you may have with one of our providers in the next 365 days. If you have a MyChart account, a copy of this consent can be sent to you electronically.  As this is a virtual visit, video technology does not allow for your provider to perform a traditional examination. This may limit your provider's ability to fully assess your condition. If your provider identifies any concerns that need to be evaluated in person or the need to arrange testing (such as labs, EKG, etc.), we will make arrangements to do so. Although advances in technology are sophisticated, we cannot ensure that it will always work on either your end or our end. If the connection with a video visit is poor, the visit may have to be switched to a telephone visit. With either a video or telephone visit, we are not always able to ensure that we have a secure connection.  By engaging in this virtual visit, you consent to the provision of healthcare and authorize for your insurance to be billed (if applicable) for the services provided during this visit. Depending on your insurance coverage, you may receive a charge related to this service.  I need to obtain your verbal consent now. Are you willing to proceed with your visit today? Engelbert Sevin has provided verbal consent on 05/02/2023 for a virtual visit (video or telephone). Harlene PEDLAR Ward, PA-C  Date: 05/02/2023 11:07 AM  Virtual Visit via Video Note   I, Harlene PEDLAR Ward, connected with  Tanner Williams  ([id number], Sep 12, 1970) on 05/02/23 at 11:00 AM EST by a video-enabled telemedicine application and verified that I am speaking with the correct person using two identifiers.  Location: Patient: Virtual Visit Location Patient:  Home Provider: Virtual Visit Location Provider: Home Office   I discussed the limitations of evaluation and management by telemedicine and the availability of in person appointments. The patient expressed understanding and agreed to proceed.    History of Present Illness: Tanner Williams is a 53 y.o. who identifies as a male who was assigned male at birth, and is being seen today for right heel pain that started yesterday.  Denies injury or trauma.  Reports pain is intermittent. He has tried ibuprofen, stretching, and ice with minimal relief. Denies pain with walking.   HPI: HPI  Problems: There are no active problems to display for this patient.   Allergies: No Known Allergies Medications:  Current Outpatient Medications:    cephALEXin  (KEFLEX ) 500 MG capsule, Take 1 capsule (500 mg total) by mouth 4 (four) times daily., Disp: 28 capsule, Rfl: 0   clindamycin  (CLEOCIN  T) 1 % external solution, Apply twice daily as directed, Disp: 60 mL, Rfl: 5   clindamycin  (CLINDAGEL ) 1 % gel, Apply to affected areas once daily., Disp: 30 g, Rfl: 5   clindamycin  (CLINDAGEL ) 1 % gel, Apply to affected Williams(s) once a day, Disp: 30 g, Rfl: 5   clindamycin -benzoyl peroxide  (BENZACLIN) gel, Apply to affected areas every morning as directed, Disp: 50 g, Rfl: 4   COVID-19 At Home Antigen Test (CARESTART COVID-19 HOME TEST) KIT, Use as directed, Disp: 4 each, Rfl: 0   COVID-19 At Home Antigen Test (CARESTART COVID-19 HOME TEST) KIT, Use as directed, Disp: 4 each, Rfl: 1  COVID-19 At Home Antigen Test [facility] COVID-19 HOME TEST) KIT, Use as directed within package instructions., Disp: 4 each, Rfl: 0   cyclobenzaprine  (FLEXERIL ) 10 MG tablet, Take 1 tablet (10 mg total) by mouth at bedtime, as needed., Disp: 10 tablet, Rfl: 0   oseltamivir  (TAMIFLU ) 75 MG capsule, Take 1 capsule by mouth once a day., Disp: 10 capsule, Rfl: 0   oxyCODONE -acetaminophen  (ROXICET) 5-325 MG per tablet, Take 1 tablet by mouth every 4  (four) hours as needed for pain., Disp: 30 tablet, Rfl: 0   polyethylene glycol-electrolytes (NULYTELY) 420 g solution, Use as Directed., Disp: 4000 mL, Rfl: 0   tretinoin  (RETIN-A ) 0.025 % cream, Apply pea size amount externally to the entire face. Mix with moisturizer to avoid irritation. Start using every other night then increase to nightly as tolerable, Disp: 45 g, Rfl: 3  Observations/Objective: Patient is well-developed, well-nourished in no acute distress.  Resting comfortably at home.  Head is normocephalic, atraumatic.  No labored breathing.  Speech is clear and coherent with logical content.  Patient is alert and oriented at baseline.    Assessment and Plan: 1. Pain of right heel (Primary)    Recommend supportive care.  If no improvement advised follow up with PCP.   Follow Up Instructions: I discussed the assessment and treatment plan with the patient. The patient was provided an opportunity to ask questions and all were answered. The patient agreed with the plan and demonstrated an understanding of the instructions.  A copy of instructions were sent to the patient via MyChart unless otherwise noted below.     The patient was advised to call back or seek an in-person evaluation if the symptoms worsen or if the condition fails to improve as anticipated.    Harlene PEDLAR Ward, PA-C

## 2023-05-02 NOTE — Patient Instructions (Signed)
 Addie Area, thank you for joining Harlene PEDLAR Ward, PA-C for today's virtual visit.  While this provider is not your primary care provider (PCP), if your PCP is located in our provider database this encounter information will be shared with them immediately following your visit.   A [facility] MyChart account gives you access to today's visit and all your visits, tests, and labs performed at [facility]  click here if you don't have a [facility] MyChart account or go to [email or web address]  Consent: (Patient) Addie Area provided verbal consent for this virtual visit at the beginning of the encounter.  Current Medications:  Current Outpatient Medications:    cephALEXin  (KEFLEX ) 500 MG capsule, Take 1 capsule (500 mg total) by mouth 4 (four) times daily., Disp: 28 capsule, Rfl: 0   clindamycin  (CLEOCIN  T) 1 % external solution, Apply twice daily as directed, Disp: 60 mL, Rfl: 5   clindamycin  (CLINDAGEL ) 1 % gel, Apply to affected areas once daily., Disp: 30 g, Rfl: 5   clindamycin  (CLINDAGEL ) 1 % gel, Apply to affected area(s) once a day, Disp: 30 g, Rfl: 5   clindamycin -benzoyl peroxide  (BENZACLIN) gel, Apply to affected areas every morning as directed, Disp: 50 g, Rfl: 4   COVID-19 At Home Antigen Test (CARESTART COVID-19 HOME TEST) KIT, Use as directed, Disp: 4 each, Rfl: 0   COVID-19 At Home Antigen Test (CARESTART COVID-19 HOME TEST) KIT, Use as directed, Disp: 4 each, Rfl: 1   COVID-19 At Home Antigen Test (CARESTART COVID-19 HOME TEST) KIT, Use as directed within package instructions., Disp: 4 each, Rfl: 0   cyclobenzaprine  (FLEXERIL ) 10 MG tablet, Take 1 tablet (10 mg total) by mouth at bedtime, as needed., Disp: 10 tablet, Rfl: 0   oseltamivir  (TAMIFLU ) 75 MG capsule, Take 1 capsule by mouth once a day., Disp: 10 capsule, Rfl: 0   oxyCODONE -acetaminophen  (ROXICET) 5-325 MG per tablet, Take 1 tablet by mouth every 4 (four) hours as needed for pain., Disp: 30 tablet,  Rfl: 0   polyethylene glycol-electrolytes (NULYTELY) 420 g solution, Use as Directed., Disp: 4000 mL, Rfl: 0   tretinoin  (RETIN-A ) 0.025 % cream, Apply pea size amount externally to the entire face. Mix with moisturizer to avoid irritation. Start using every other night then increase to nightly as tolerable, Disp: 45 g, Rfl: 3   Medications ordered in this encounter:  No orders of the defined types were placed in this encounter.    *If you need refills on other medications prior to your next appointment, please contact your pharmacy*  Follow-Up: Call back or seek an in-person evaluation if the symptoms worsen or if the condition fails to improve as anticipated.  [facility] [phone]  Other Instructions Recommend ice, rest, ibuprofen as needed for pain.  Recommend hard soled shoes for more support.  If no improvement follow up for in person evaluation.    If you have been instructed to have an in-person evaluation today at a local Urgent Care facility, please use the link below. It will take you to a list of all of our available [facility], including address, phone number and hours of operation. Please do not delay care.  [facility]  If you or a family member do not have a primary care provider, use the link below to schedule a visit and establish care. When you choose a [facility] primary care physician or advanced practice provider, you gain a long-term partner in health. Find  a Primary Care Provider  Learn more about [facility]'s in-office and virtual care options: [facility] - Get Care Now

## 2023-06-11 DIAGNOSIS — F4321 Adjustment disorder with depressed mood: Secondary | ICD-10-CM | POA: Diagnosis not present

## 2023-06-23 ENCOUNTER — Other Ambulatory Visit (HOSPITAL_COMMUNITY): Payer: Self-pay

## 2023-06-25 DIAGNOSIS — F4321 Adjustment disorder with depressed mood: Secondary | ICD-10-CM | POA: Diagnosis not present

## 2023-06-26 ENCOUNTER — Other Ambulatory Visit (HOSPITAL_COMMUNITY): Payer: Self-pay

## 2023-06-28 ENCOUNTER — Other Ambulatory Visit (HOSPITAL_COMMUNITY): Payer: Self-pay

## 2023-07-05 DIAGNOSIS — F4321 Adjustment disorder with depressed mood: Secondary | ICD-10-CM | POA: Diagnosis not present

## 2023-09-27 ENCOUNTER — Other Ambulatory Visit (HOSPITAL_COMMUNITY): Payer: Self-pay

## 2023-09-28 ENCOUNTER — Other Ambulatory Visit (HOSPITAL_COMMUNITY): Payer: Self-pay

## 2023-09-28 DIAGNOSIS — D1801 Hemangioma of skin and subcutaneous tissue: Secondary | ICD-10-CM | POA: Diagnosis not present

## 2023-09-28 DIAGNOSIS — L578 Other skin changes due to chronic exposure to nonionizing radiation: Secondary | ICD-10-CM | POA: Diagnosis not present

## 2023-09-28 DIAGNOSIS — L821 Other seborrheic keratosis: Secondary | ICD-10-CM | POA: Diagnosis not present

## 2023-09-28 DIAGNOSIS — L918 Other hypertrophic disorders of the skin: Secondary | ICD-10-CM | POA: Diagnosis not present

## 2023-09-28 DIAGNOSIS — L814 Other melanin hyperpigmentation: Secondary | ICD-10-CM | POA: Diagnosis not present

## 2023-09-28 DIAGNOSIS — L700 Acne vulgaris: Secondary | ICD-10-CM | POA: Diagnosis not present

## 2023-09-28 MED ORDER — TRETINOIN 0.025 % EX CREA
TOPICAL_CREAM | CUTANEOUS | 5 refills | Status: AC
  Filled 2023-09-28: qty 20, 30d supply, fill #0
  Filled 2023-11-15 – 2023-12-03 (×2): qty 20, 30d supply, fill #1
  Filled 2023-12-03: qty 45, 30d supply, fill #1
  Filled 2024-02-21 (×2): qty 20, 30d supply, fill #2
  Filled 2024-03-30: qty 20, 30d supply, fill #3

## 2023-09-28 MED ORDER — CLINDAMYCIN PHOSPHATE 1 % EX LOTN
TOPICAL_LOTION | Freq: Every morning | CUTANEOUS | 11 refills | Status: AC
  Filled 2023-09-28: qty 60, 30d supply, fill #0
  Filled 2023-11-15 – 2023-12-03 (×3): qty 60, 30d supply, fill #1
  Filled 2024-02-21 (×2): qty 60, 30d supply, fill #2
  Filled 2024-03-29: qty 60, 30d supply, fill #3
  Filled 2024-04-22: qty 60, 30d supply, fill #4

## 2023-09-30 ENCOUNTER — Other Ambulatory Visit (HOSPITAL_COMMUNITY): Payer: Self-pay

## 2023-09-30 MED ORDER — TRETINOIN 0.025 % EX CREA
1.0000 | TOPICAL_CREAM | CUTANEOUS | 3 refills | Status: AC
  Filled 2023-09-30 – 2024-03-29 (×3): qty 45, 30d supply, fill #0

## 2023-10-12 DIAGNOSIS — E782 Mixed hyperlipidemia: Secondary | ICD-10-CM | POA: Diagnosis not present

## 2023-10-12 DIAGNOSIS — Z9289 Personal history of other medical treatment: Secondary | ICD-10-CM | POA: Diagnosis not present

## 2023-10-12 DIAGNOSIS — Z23 Encounter for immunization: Secondary | ICD-10-CM | POA: Diagnosis not present

## 2023-10-12 DIAGNOSIS — Z1211 Encounter for screening for malignant neoplasm of colon: Secondary | ICD-10-CM | POA: Diagnosis not present

## 2023-10-12 DIAGNOSIS — L709 Acne, unspecified: Secondary | ICD-10-CM | POA: Diagnosis not present

## 2023-10-12 DIAGNOSIS — Z8042 Family history of malignant neoplasm of prostate: Secondary | ICD-10-CM | POA: Diagnosis not present

## 2023-10-12 DIAGNOSIS — Z0000 Encounter for general adult medical examination without abnormal findings: Secondary | ICD-10-CM | POA: Diagnosis not present

## 2023-11-15 ENCOUNTER — Other Ambulatory Visit (HOSPITAL_COMMUNITY): Payer: Self-pay

## 2023-11-15 ENCOUNTER — Other Ambulatory Visit: Payer: Self-pay

## 2023-11-25 ENCOUNTER — Other Ambulatory Visit (HOSPITAL_COMMUNITY): Payer: Self-pay

## 2023-12-03 ENCOUNTER — Other Ambulatory Visit: Payer: Self-pay

## 2023-12-03 ENCOUNTER — Other Ambulatory Visit (HOSPITAL_COMMUNITY): Payer: Self-pay

## 2023-12-10 DIAGNOSIS — F4321 Adjustment disorder with depressed mood: Secondary | ICD-10-CM | POA: Diagnosis not present

## 2023-12-20 DIAGNOSIS — F4321 Adjustment disorder with depressed mood: Secondary | ICD-10-CM | POA: Diagnosis not present

## 2024-01-10 ENCOUNTER — Other Ambulatory Visit (HOSPITAL_COMMUNITY): Payer: Self-pay

## 2024-01-10 MED ORDER — FLUZONE 0.5 ML IM SUSY
0.5000 mL | PREFILLED_SYRINGE | Freq: Once | INTRAMUSCULAR | 0 refills | Status: AC
  Filled 2024-01-10: qty 0.5, 1d supply, fill #0

## 2024-01-10 MED ORDER — COMIRNATY 30 MCG/0.3ML IM SUSY
0.3000 mL | PREFILLED_SYRINGE | Freq: Once | INTRAMUSCULAR | 0 refills | Status: AC
  Filled 2024-01-10: qty 0.3, 1d supply, fill #0

## 2024-01-10 MED ORDER — SHINGRIX 50 MCG/0.5ML IM SUSR
0.5000 mL | Freq: Once | INTRAMUSCULAR | 0 refills | Status: AC
  Filled 2024-01-10: qty 0.5, 1d supply, fill #0

## 2024-01-12 ENCOUNTER — Other Ambulatory Visit (HOSPITAL_COMMUNITY): Payer: Self-pay

## 2024-01-19 DIAGNOSIS — F4321 Adjustment disorder with depressed mood: Secondary | ICD-10-CM | POA: Diagnosis not present

## 2024-02-02 DIAGNOSIS — F4321 Adjustment disorder with depressed mood: Secondary | ICD-10-CM | POA: Diagnosis not present

## 2024-02-21 ENCOUNTER — Other Ambulatory Visit: Payer: Self-pay

## 2024-02-21 ENCOUNTER — Other Ambulatory Visit (HOSPITAL_COMMUNITY): Payer: Self-pay

## 2024-02-21 DIAGNOSIS — F4321 Adjustment disorder with depressed mood: Secondary | ICD-10-CM | POA: Diagnosis not present

## 2024-02-22 ENCOUNTER — Other Ambulatory Visit: Payer: Self-pay

## 2024-03-06 DIAGNOSIS — F4321 Adjustment disorder with depressed mood: Secondary | ICD-10-CM | POA: Diagnosis not present

## 2024-03-29 ENCOUNTER — Other Ambulatory Visit (HOSPITAL_COMMUNITY): Payer: Self-pay

## 2024-03-29 ENCOUNTER — Other Ambulatory Visit: Payer: Self-pay

## 2024-03-31 ENCOUNTER — Other Ambulatory Visit (HOSPITAL_COMMUNITY): Payer: Self-pay
